# Patient Record
Sex: Male | Born: 1965 | Race: Black or African American | Hispanic: No | Marital: Single | Smoking: Current every day smoker
Health system: Southern US, Community
[De-identification: ages and names within clinical notes are randomized; demographics above are authoritative.]

## PROBLEM LIST (undated history)

## (undated) DIAGNOSIS — I10 Essential (primary) hypertension: Secondary | ICD-10-CM

## (undated) HISTORY — PX: OTHER SURGICAL HISTORY: SHX169

---

## 2004-12-20 ENCOUNTER — Ambulatory Visit: Payer: Self-pay

## 2005-05-29 ENCOUNTER — Emergency Department: Payer: Self-pay | Admitting: General Practice

## 2005-06-17 ENCOUNTER — Emergency Department: Payer: Self-pay | Admitting: Emergency Medicine

## 2006-10-24 IMAGING — CR DG LUMBAR SPINE 2-3V
1 series · 3 of 3 positions shown · non-contrast
Comparison: none

REASON FOR EXAM: Arthritis and back pain. ([REDACTED] [REDACTED]
EXT 9429)
COMMENTS:

[Series 1: view not recorded · 0.17mm/px · 3 of 3 slices shown]
[im 1/3]
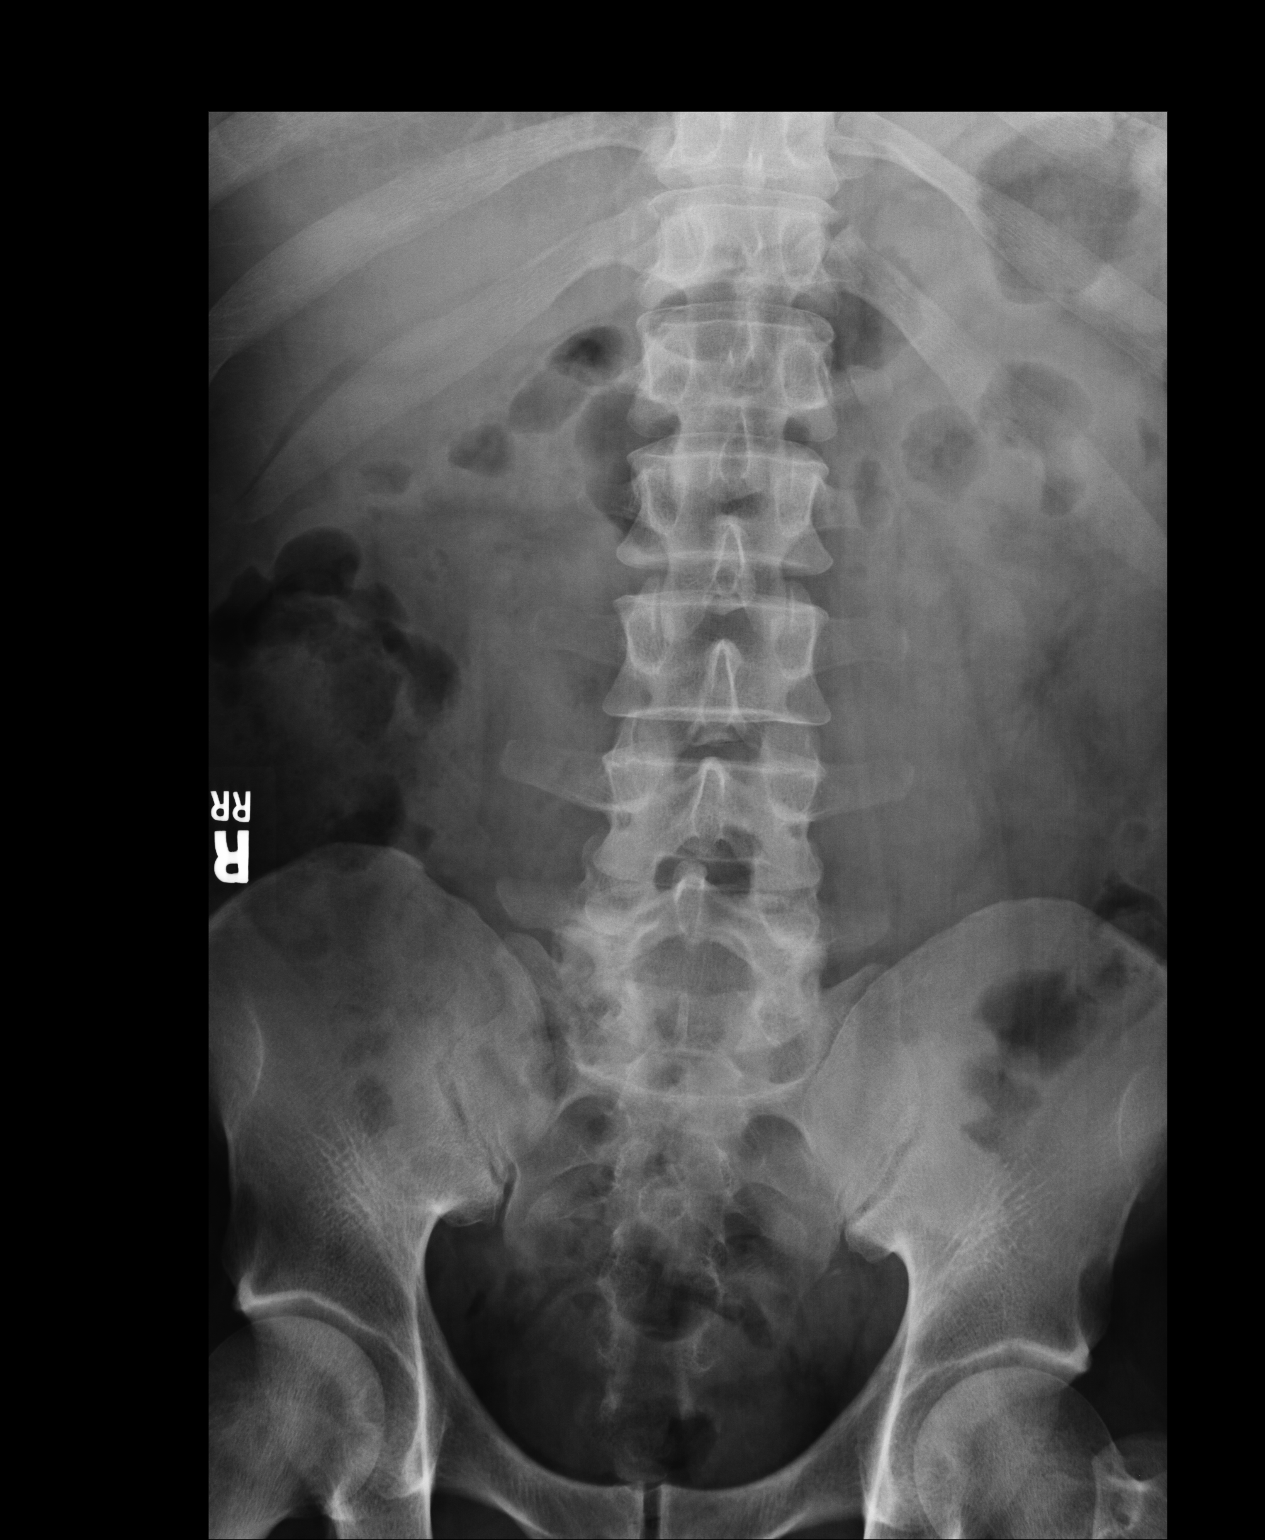
[im 2/3]
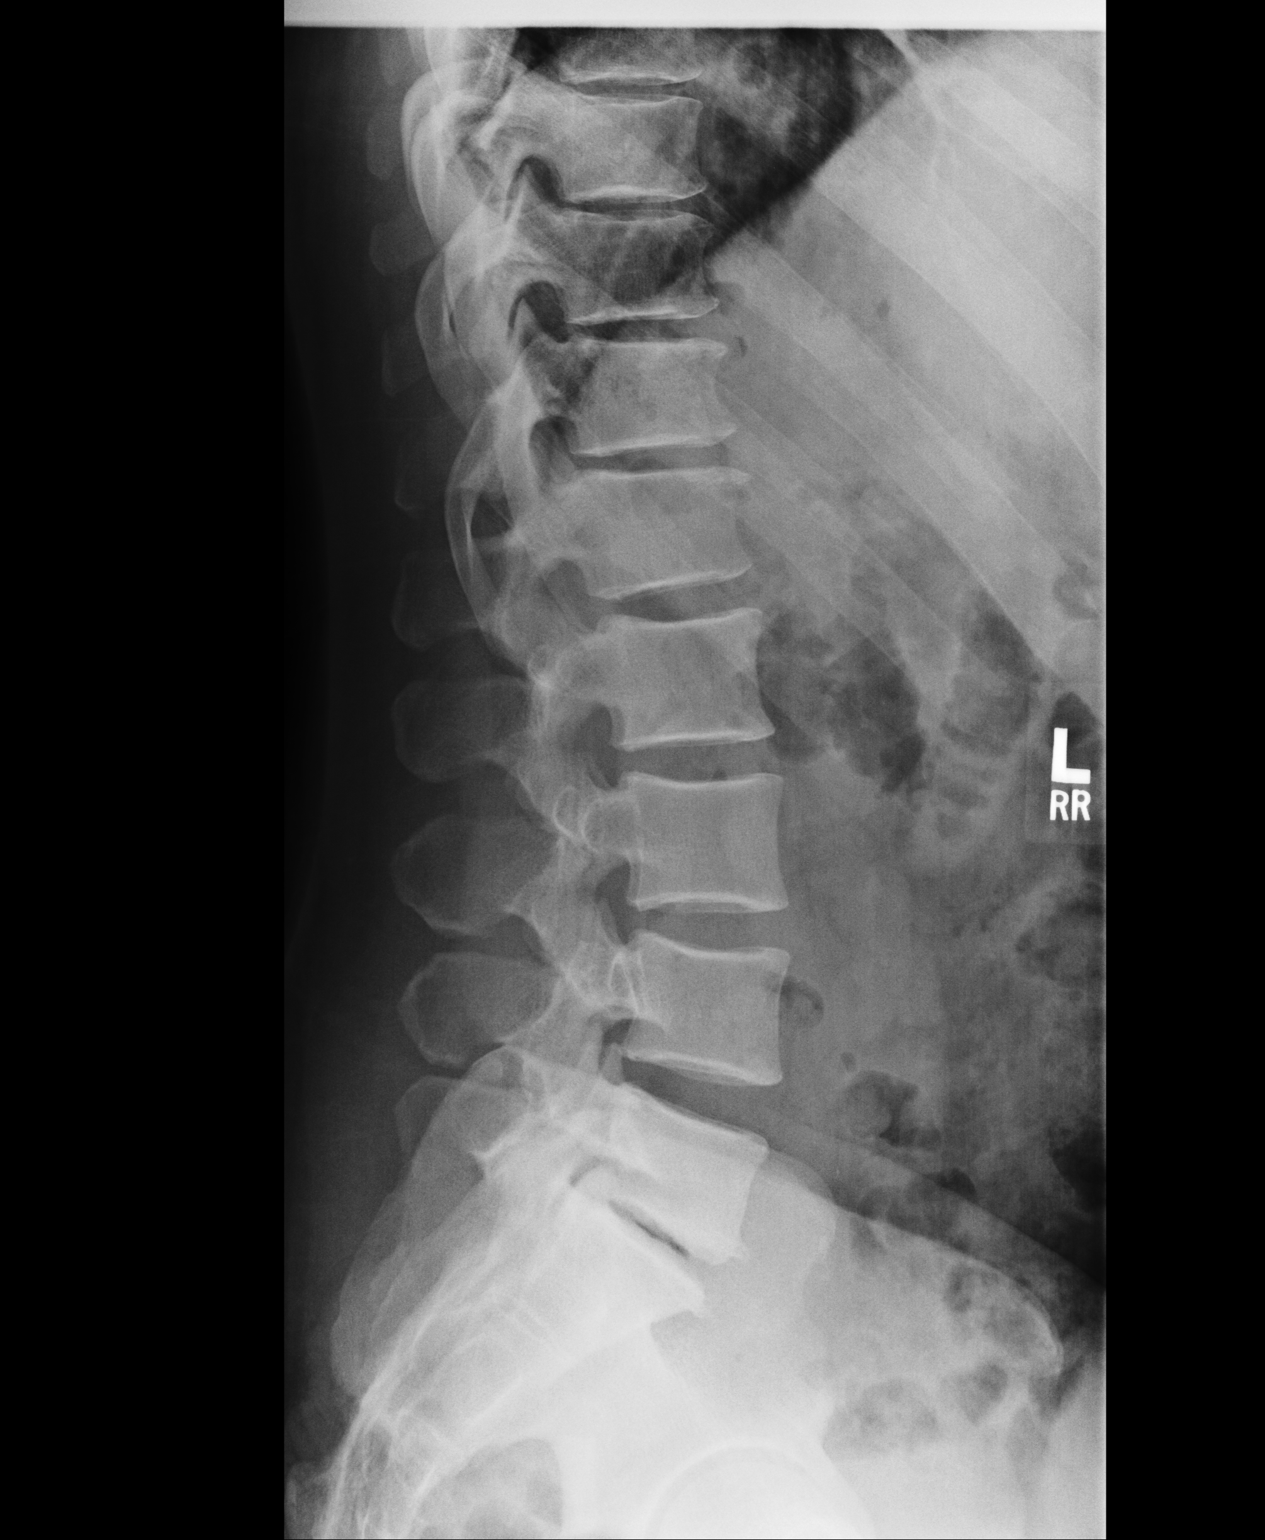
[im 3/3]
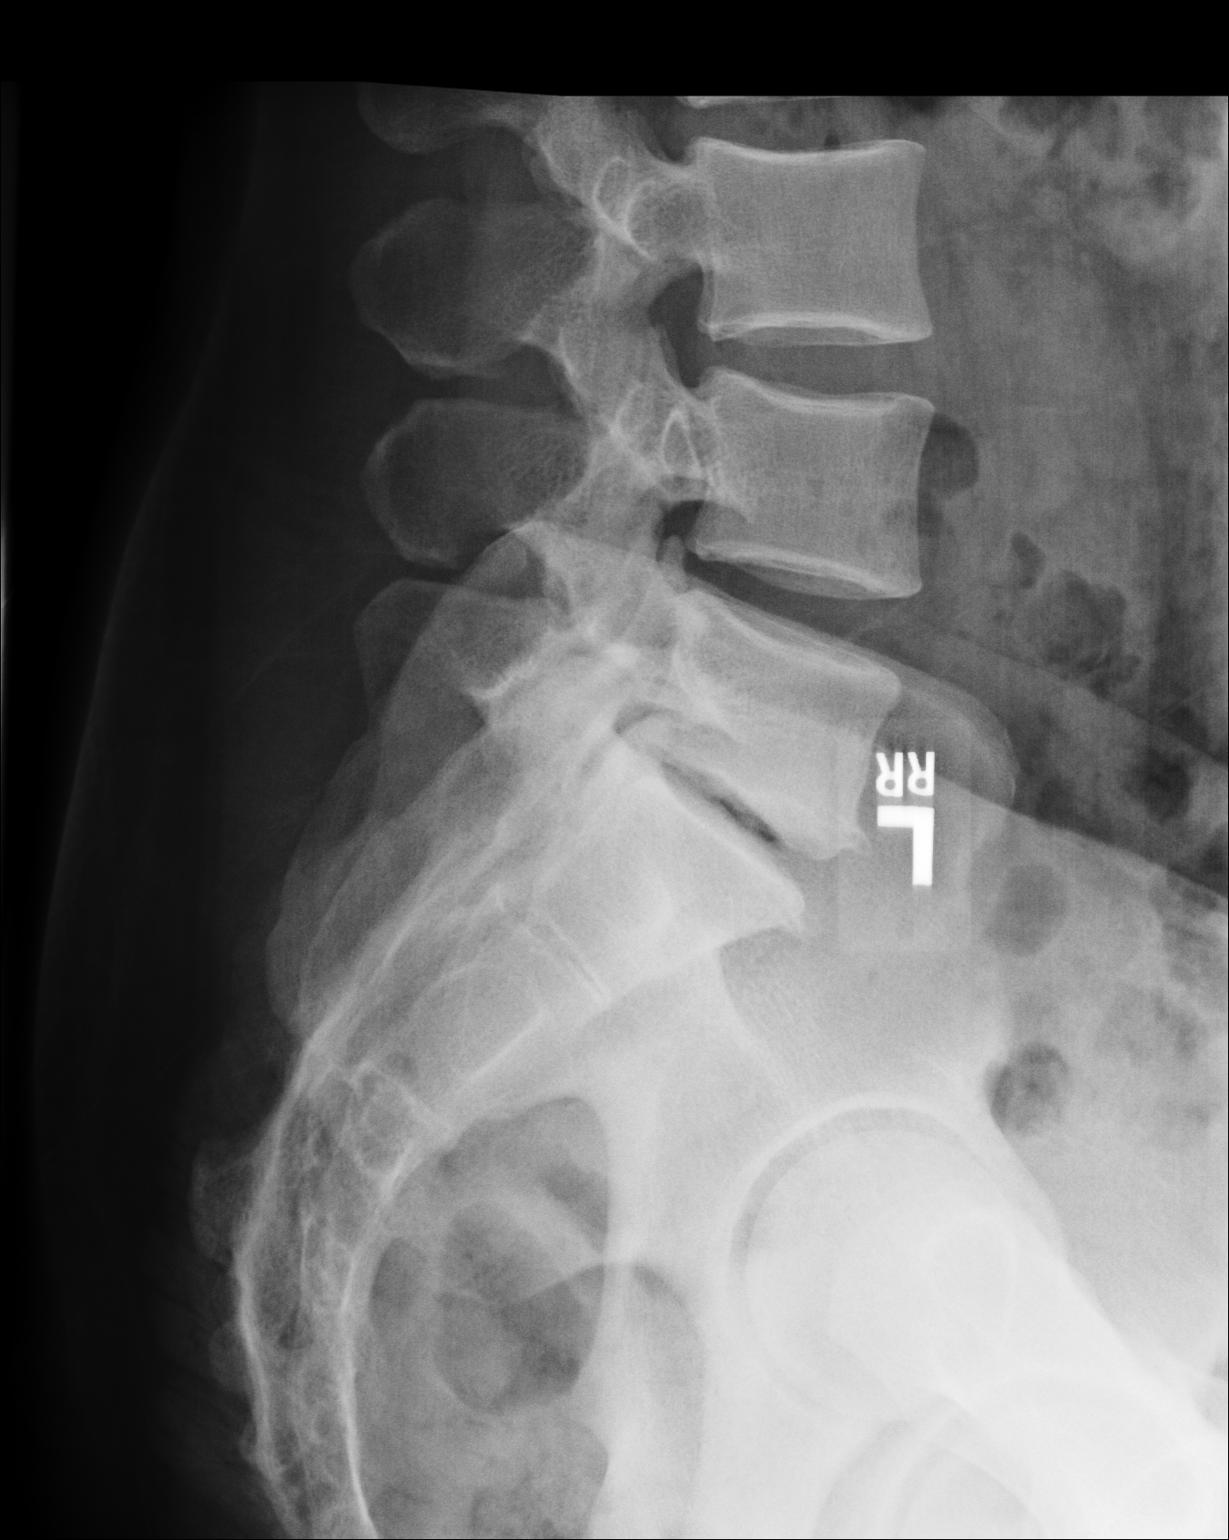

[3 of 3 positions shown; findings below may reference images not displayed]

PROCEDURE:     DXR - DXR LUMBAR SPINE AP AND LATERAL  - December 20, 2004  [DATE]

RESULT:     AP and lateral views of the lumbar spine demonstrate the sacral
arches to be intact.  The posterior elements appear intact.  There appears
to be normal alignment.  The vertebral body heights appear to be maintained.
 There appears to be some intervertebral disk space narrowing at L5-S1.
Some minimal anterior bony spurring is seen at T11 and T12 as well as L1.
IMPRESSION: Mild degenerative changes in the thoracolumbar region and some disk space
narrowing at L5-S1.  No acute bony abnormality.

## 2013-10-15 ENCOUNTER — Emergency Department: Payer: Self-pay | Admitting: Emergency Medicine

## 2016-05-28 ENCOUNTER — Emergency Department (HOSPITAL_COMMUNITY)
Admission: EM | Admit: 2016-05-28 | Discharge: 2016-05-28 | Disposition: A | Discharge status: Home / Self Care | Payer: Self-pay | Attending: Emergency Medicine | Admitting: Emergency Medicine

## 2016-05-28 ENCOUNTER — Encounter (HOSPITAL_COMMUNITY): Payer: Self-pay | Admitting: Emergency Medicine

## 2016-05-28 ENCOUNTER — Emergency Department (HOSPITAL_COMMUNITY): Payer: Self-pay

## 2016-05-28 DIAGNOSIS — F17210 Nicotine dependence, cigarettes, uncomplicated: Secondary | ICD-10-CM | POA: Insufficient documentation

## 2016-05-28 DIAGNOSIS — X501XXA Overexertion from prolonged static or awkward postures, initial encounter: Secondary | ICD-10-CM | POA: Insufficient documentation

## 2016-05-28 DIAGNOSIS — Y999 Unspecified external cause status: Secondary | ICD-10-CM | POA: Insufficient documentation

## 2016-05-28 DIAGNOSIS — Y929 Unspecified place or not applicable: Secondary | ICD-10-CM | POA: Insufficient documentation

## 2016-05-28 DIAGNOSIS — S46911A Strain of unspecified muscle, fascia and tendon at shoulder and upper arm level, right arm, initial encounter: Secondary | ICD-10-CM | POA: Insufficient documentation

## 2016-05-28 DIAGNOSIS — T148XXA Other injury of unspecified body region, initial encounter: Secondary | ICD-10-CM

## 2016-05-28 DIAGNOSIS — I10 Essential (primary) hypertension: Secondary | ICD-10-CM | POA: Insufficient documentation

## 2016-05-28 DIAGNOSIS — Y9389 Activity, other specified: Secondary | ICD-10-CM | POA: Insufficient documentation

## 2016-05-28 HISTORY — DX: Essential (primary) hypertension: I10

## 2016-05-28 MED ORDER — IBUPROFEN 600 MG PO TABS: 600.0000 mg | ORAL_TABLET | Freq: Four times a day (QID) | ORAL | 0 refills | Status: AC | PRN

## 2016-05-28 MED ORDER — KETOROLAC TROMETHAMINE 30 MG/ML IJ SOLN
30.0000 mg | Freq: Once | INTRAMUSCULAR | Status: AC
  Administered 2016-05-28: 30 mg via INTRAMUSCULAR
  Filled 2016-05-28: qty 1

## 2016-05-28 MED ORDER — IBUPROFEN 800 MG PO TABS: 800.0000 mg | ORAL_TABLET | Freq: Once | ORAL | Status: DC

## 2016-05-28 MED ORDER — DIAZEPAM 5 MG PO TABS
5.0000 mg | ORAL_TABLET | Freq: Once | ORAL | Status: AC
  Administered 2016-05-28: 5 mg via ORAL
  Filled 2016-05-28: qty 1

## 2016-05-28 NOTE — Discharge Instructions (Signed)
Ice affected area as needed for pain. Ibuprofen as needed for additional pain relief.  If no improvement in the next 2-3 days, please call the orthopedist listed below and schedule a follow up appointment.  Return to ER for new or worsening symptoms, any additional concerns.

## 2016-05-28 NOTE — ED Triage Notes (Signed)
Pt reports he hurt his bicep back in 2002 and never got it looked at. Pt walked to the ER this am because he could not take the pain any longer.

## 2016-05-28 NOTE — ED Notes (Signed)
Pt reports he hurt his bicep back in 2002 and never got it looked at. Pt walked to the ER this am because he could not take the pain any longer.  

## 2016-05-28 NOTE — ED Notes (Addendum)
Upon entering pt's room for medication administration, the patient was laying on his affected side moving his arm around. Pt then pushed himself up using his right arm to take his medication.
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
## 2018-01-13 ENCOUNTER — Emergency Department (HOSPITAL_COMMUNITY)
Admission: EM | Admit: 2018-01-13 | Discharge: 2018-01-13 | Disposition: A | Discharge status: Home / Self Care | Payer: Self-pay | Attending: Emergency Medicine | Admitting: Emergency Medicine

## 2018-01-13 DIAGNOSIS — M545 Low back pain: Secondary | ICD-10-CM | POA: Insufficient documentation

## 2018-01-13 DIAGNOSIS — Z5321 Procedure and treatment not carried out due to patient leaving prior to being seen by health care provider: Secondary | ICD-10-CM | POA: Insufficient documentation

## 2018-01-13 DIAGNOSIS — Y998 Other external cause status: Secondary | ICD-10-CM | POA: Insufficient documentation

## 2018-01-13 DIAGNOSIS — Y939 Activity, unspecified: Secondary | ICD-10-CM | POA: Insufficient documentation

## 2018-01-13 DIAGNOSIS — Y92410 Unspecified street and highway as the place of occurrence of the external cause: Secondary | ICD-10-CM | POA: Insufficient documentation

## 2018-01-13 DIAGNOSIS — M25562 Pain in left knee: Secondary | ICD-10-CM | POA: Insufficient documentation
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
## 2018-01-13 NOTE — ED Triage Notes (Signed)
Pt now requesting to leave... ambulatory

## 2018-04-01 IMAGING — DX DG SHOULDER 2+V*R*
4 series · 4 of 4 positions shown · non-contrast
Comparison: None.

CLINICAL DATA: R shoulder pain, NKI X 1 day

EXAM:
RIGHT SHOULDER - 2+ VIEW

[shoulder grashey]
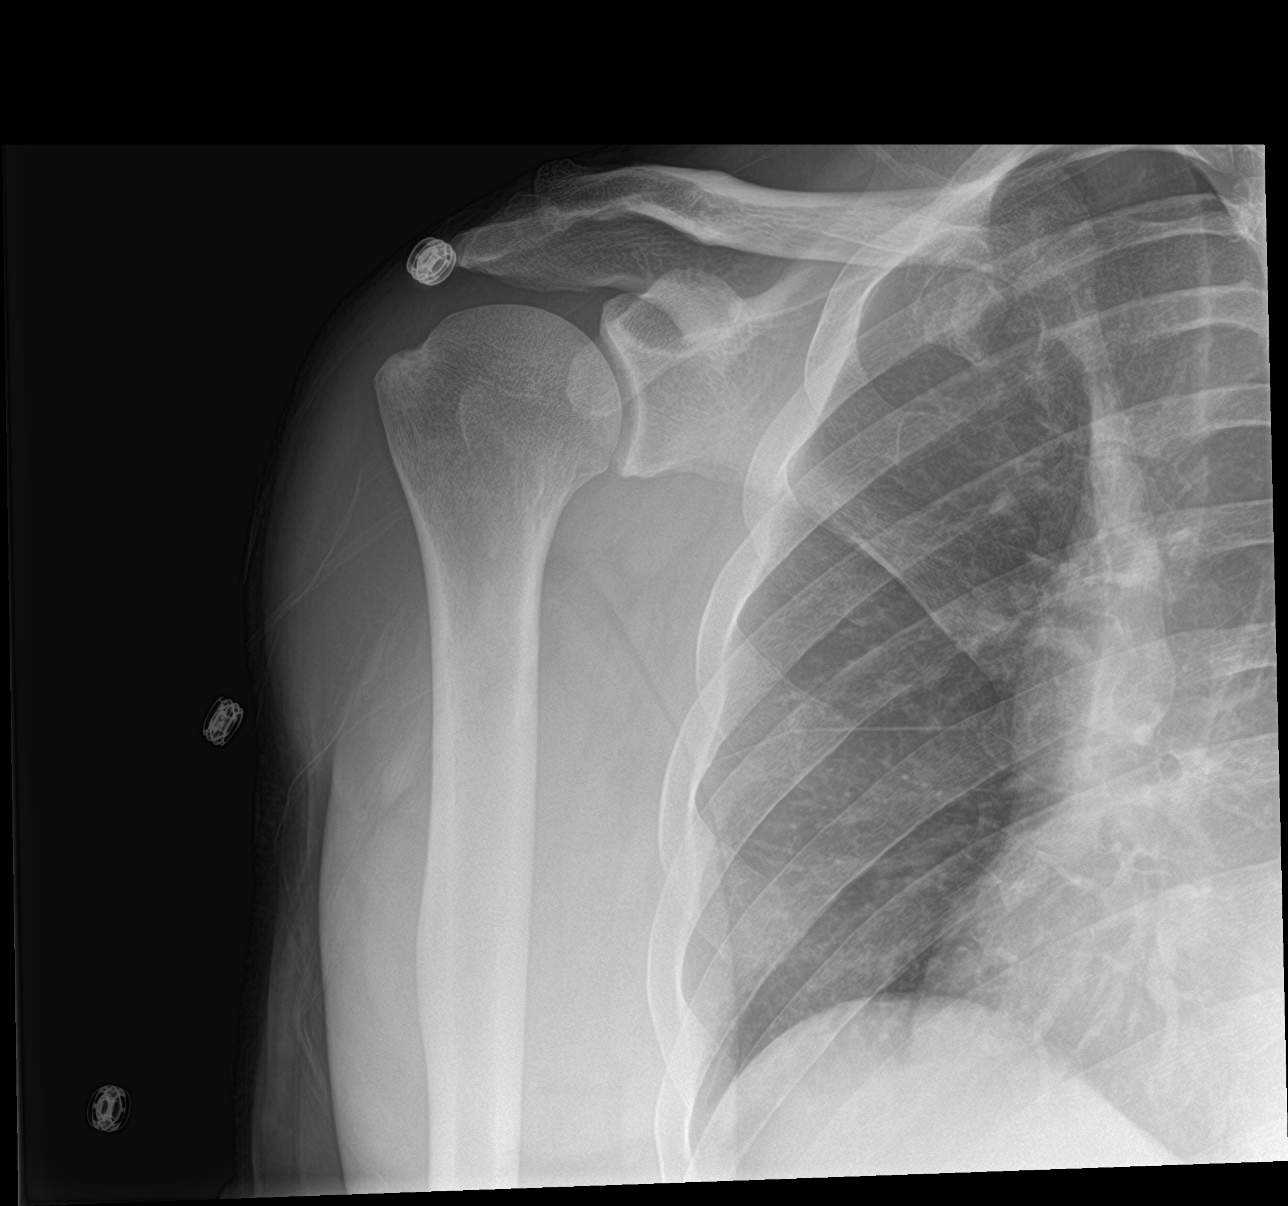

[shoulder y view (1 of 2)]
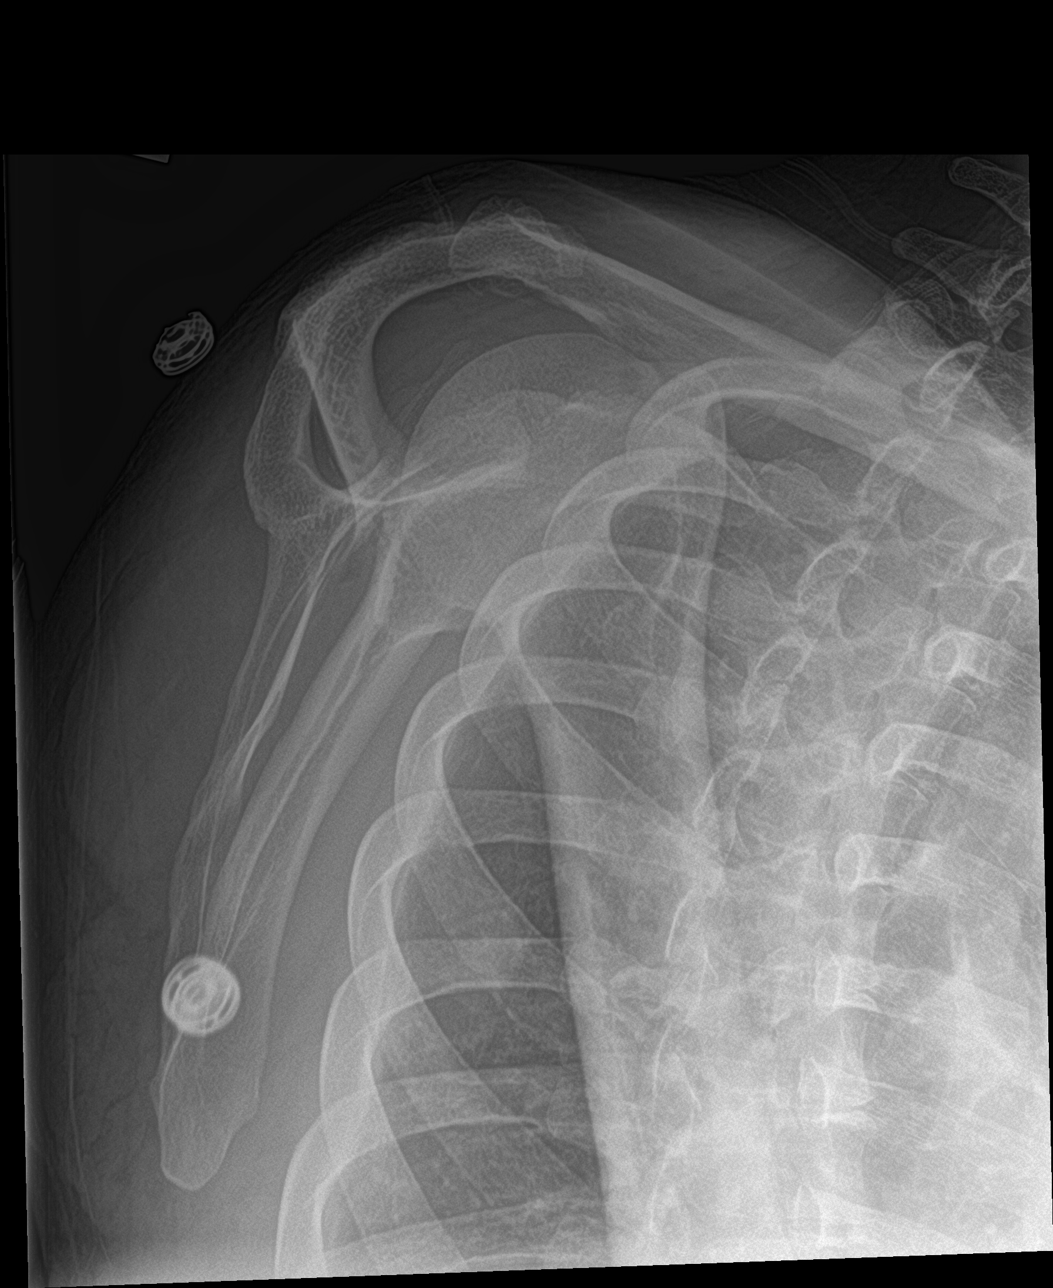

[shoulder axillary]
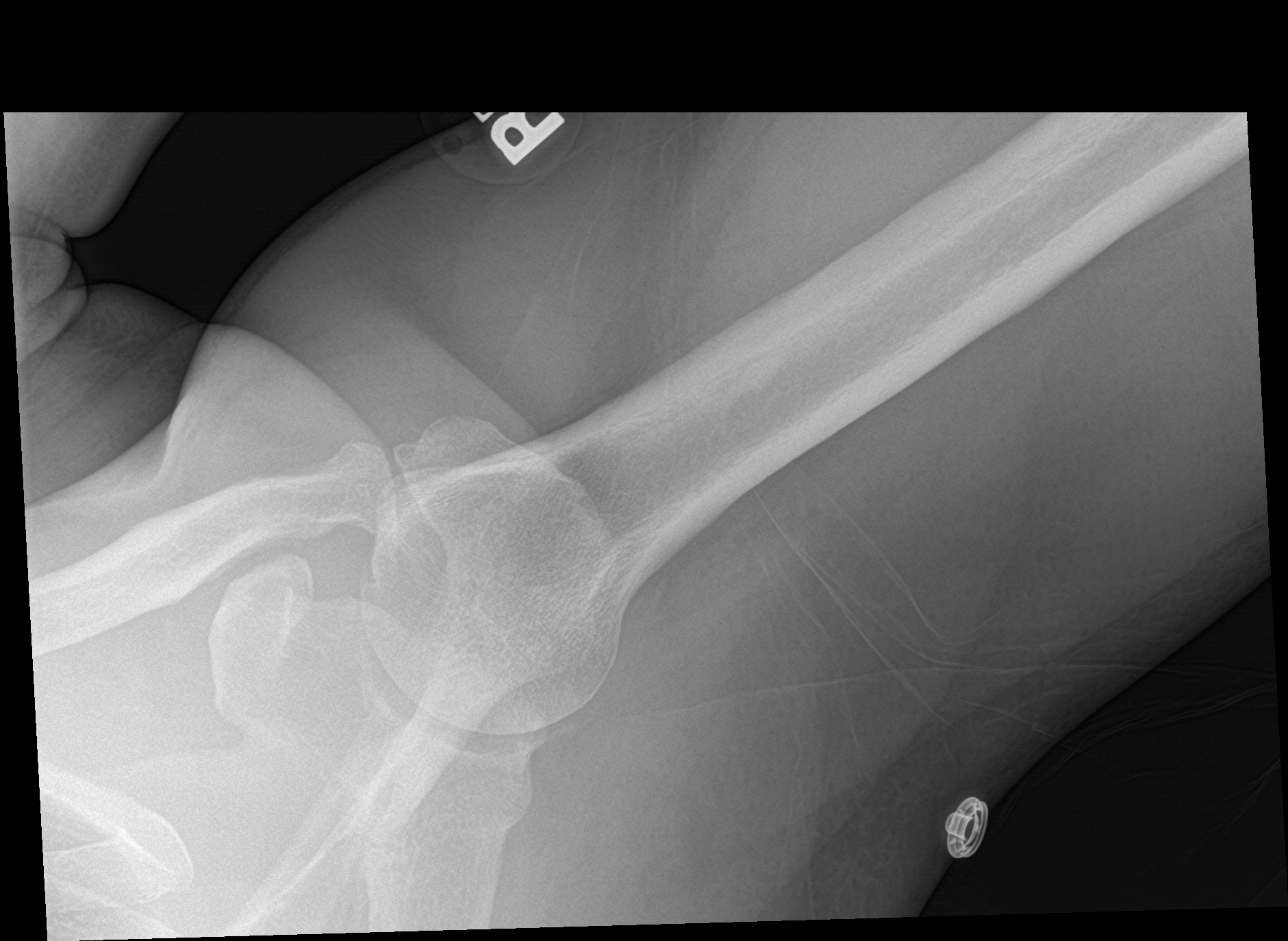

[shoulder y view (2 of 2)]
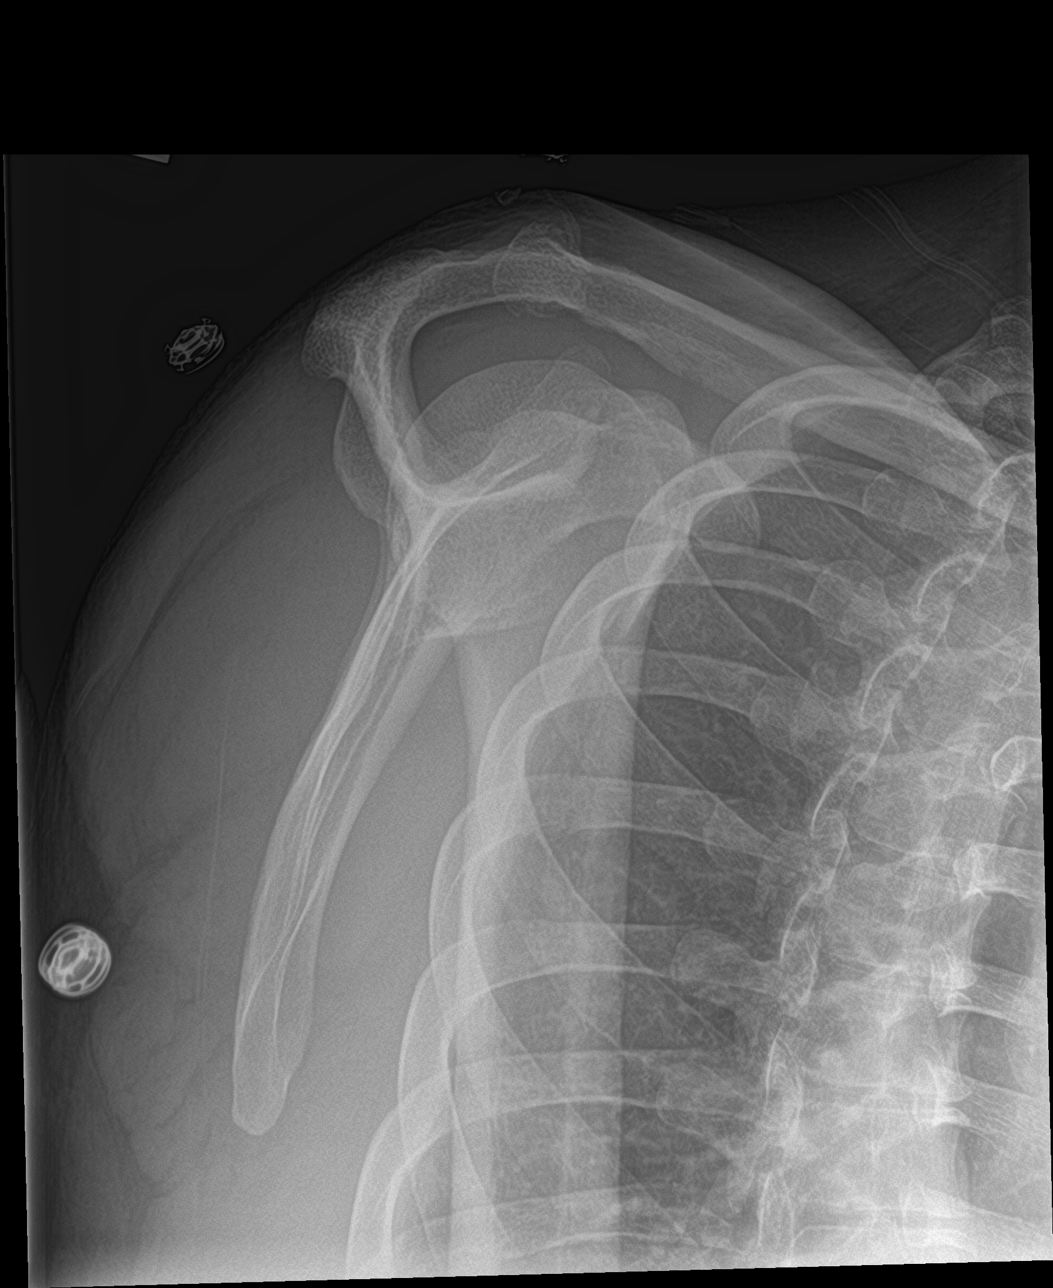

[4 of 4 positions shown; findings below may reference images not displayed]

FINDINGS: There is no evidence of fracture or dislocation. There is no
evidence of arthropathy or other focal bone abnormality. Soft
tissues are unremarkable.
IMPRESSION: Negative.

## 2018-09-02 NOTE — Progress Notes (Signed)
[facility] Screening performed. Temperature, PHQ-9, and need for medical care and medications assessed. No additional needs assessed at this time.  Latressa Harries  MSN, RN 

## 2018-09-07 NOTE — Progress Notes (Signed)
Carlos Mcgu[facility] Screening performed. COVID screening, temperature, PHQ-9, and need for medical care and medications assessed. Patient has not been on his blood pressure and mental health medication for several months. Requesting assistance with getting a provider, mental health referral, assistance with medications, assistance with obtaining medicaid. Referral sent to [facility] Igo.  Arnold Long RN MSN

## 2018-09-09 NOTE — Progress Notes (Signed)
[place] Screening performed. COVID screening, temperature, PHQ-9, and need for medical care and medications assessed. Patient is in need of a provider. Referral sent to [facility] Igo.  Arnold Long RN MSN

## 2018-09-27 NOTE — Progress Notes (Signed)
[facility] Screening performed. Temperature, PHQ-9, and need for medical care and medications assessed. Referred to Dr. Joya Gaskins for medication assistance.  Jobe Igo MSN, RN

## 2018-09-28 ENCOUNTER — Other Ambulatory Visit: Payer: Self-pay

## 2018-09-28 DIAGNOSIS — Z20822 Contact with and (suspected) exposure to covid-19: Secondary | ICD-10-CM

## 2018-09-30 NOTE — Progress Notes (Signed)
[place] Screening performed. Temperature, PHQ-9, and medication assessed. Patient reports being on several medications that are about to run out and he does not have access to obtain additional medications. Pt referred to [facility].Jobe Igo MSN, RN

## 2018-10-04 LAB — NOVEL CORONAVIRUS, NAA: SARS-CoV-2, NAA: NOT DETECTED

## 2018-10-10 NOTE — Progress Notes (Signed)
COVID-19 Screening performed. Temperature, PHQ-9, and need for medical care and medications assessed. PT referred for medication assistance  Jobe Igo MSN, RN

## 2018-10-23 NOTE — Progress Notes (Signed)
COVID-19 Screening performed. Temperature, PHQ-9, and medication assessment. Patient reports being on several medications that are about to run out and he does not have access to obtain additional medications. Referred for medication assistance.   Mozelle Remlinger MSN, RN 

## 2018-11-08 ENCOUNTER — Telehealth: Payer: Self-pay

## 2018-11-08 NOTE — Telephone Encounter (Signed)
Message received from Jobe Igo, RN/CNP requesting an appointment for patient to establish care.  Informed her that an appointment has been scheduled for 11/20/2018 @ 1430 @ [place].

## 2018-11-09 ENCOUNTER — Telehealth: Payer: Self-pay

## 2018-11-09 NOTE — Telephone Encounter (Signed)
Call pt at last known telephone number about requested medical appt, no answer. Message left requesting a return call.  Jobe Igo RN, CNP

## 2018-11-11 NOTE — Progress Notes (Signed)
COVID-19 Screening performed. Temperature, PHQ-9, and need for medical care and medications assessed. No additional needs assessed at this time.  Louanna Vanliew MSN, RN 

## 2018-11-20 ENCOUNTER — Ambulatory Visit (INDEPENDENT_AMBULATORY_CARE_PROVIDER_SITE_OTHER): Payer: Self-pay | Admitting: Primary Care

## 2018-12-10 NOTE — Progress Notes (Signed)
COVID-19 Screening performed. Temperature, PHQ-9, and need for medical care and medications assessed. No additional needs assessed at this time.  Chia Rock MSN, RN 

## 2018-12-11 NOTE — Progress Notes (Signed)
COVID-19 Screening performed. Temperature, PHQ-9, and need for medical care and medications assessed. No additional needs assessed at this time.  Yaman Grauberger MSN, RN 

## 2018-12-15 NOTE — Telephone Encounter (Signed)
Called pt regarding requested primary care appt, No answer.
